# Patient Record
Sex: Female | Born: 1955 | Race: Asian | Hispanic: No
Health system: Southern US, Community
[De-identification: ages and names within clinical notes are randomized; demographics above are authoritative.]

---

## 2017-06-06 ENCOUNTER — Emergency Department (HOSPITAL_COMMUNITY): Payer: BLUE CROSS/BLUE SHIELD

## 2017-06-06 ENCOUNTER — Emergency Department (HOSPITAL_COMMUNITY)
Admission: EM | Admit: 2017-06-06 | Discharge: 2017-06-06 | Disposition: A | Discharge status: Home / Self Care | Payer: BLUE CROSS/BLUE SHIELD | Attending: Emergency Medicine | Admitting: Emergency Medicine

## 2017-06-06 DIAGNOSIS — S42202A Unspecified fracture of upper end of left humerus, initial encounter for closed fracture: Secondary | ICD-10-CM

## 2017-06-06 DIAGNOSIS — S42295A Other nondisplaced fracture of upper end of left humerus, initial encounter for closed fracture: Secondary | ICD-10-CM | POA: Insufficient documentation

## 2017-06-06 DIAGNOSIS — S4992XA Unspecified injury of left shoulder and upper arm, initial encounter: Secondary | ICD-10-CM | POA: Diagnosis present

## 2017-06-06 DIAGNOSIS — Y929 Unspecified place or not applicable: Secondary | ICD-10-CM | POA: Insufficient documentation

## 2017-06-06 DIAGNOSIS — Y998 Other external cause status: Secondary | ICD-10-CM | POA: Insufficient documentation

## 2017-06-06 DIAGNOSIS — W230XXA Caught, crushed, jammed, or pinched between moving objects, initial encounter: Secondary | ICD-10-CM | POA: Insufficient documentation

## 2017-06-06 DIAGNOSIS — Y939 Activity, unspecified: Secondary | ICD-10-CM | POA: Diagnosis not present

## 2017-06-06 MED ORDER — OXYCODONE-ACETAMINOPHEN 5-325 MG PO TABS: 1.0000 | ORAL_TABLET | ORAL | 0 refills | Status: AC | PRN

## 2017-06-06 MED ORDER — MORPHINE SULFATE (PF) 4 MG/ML IV SOLN
4.0000 mg | Freq: Once | INTRAVENOUS | Status: AC
  Administered 2017-06-06: 4 mg via INTRAVENOUS
  Filled 2017-06-06: qty 1

## 2017-06-06 NOTE — ED Notes (Signed)
ED Provider at bedside. 
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
## 2017-06-06 NOTE — Progress Notes (Signed)
Orthopedic Tech Progress Note Patient Details:  Nichole Hammond 09/06/1955 [id number]  Ortho Devices Type of Ortho Device: Arm sling Ortho Device/Splint Interventions: Application   Saul FordyceJennifer C Vianka Ertel 06/06/2017, 1:13 PM

## 2017-06-06 NOTE — ED Notes (Signed)
Patient transported to X-ray 

## 2017-06-06 NOTE — ED Notes (Signed)
Ortho contacted for shoulder sling

## 2018-05-09 IMAGING — CT CT CERVICAL SPINE W/O CM
3 of 4 series · 12 of 35 positions shown, 14 images · non-contrast
Comparison: None.

CLINICAL DATA: Pain following fall

EXAM:
CT HEAD WITHOUT CONTRAST
CT CERVICAL SPINE WITHOUT CONTRAST
TECHNIQUE: Multidetector CT imaging of the head and cervical spine was
performed following the standard protocol without intravenous
contrast. Multiplanar CT image reconstructions of the cervical spine
were also generated.

[Series 4: head bone · axial · 0.39mm/px · z∈[+1076,+1176]mm · 4 of 76 slices shown, 5 images]
[im 17/76  soft-tissue]
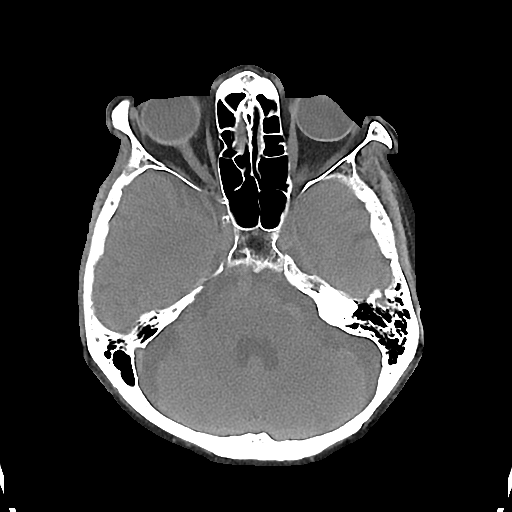
[im 17/76  bone]
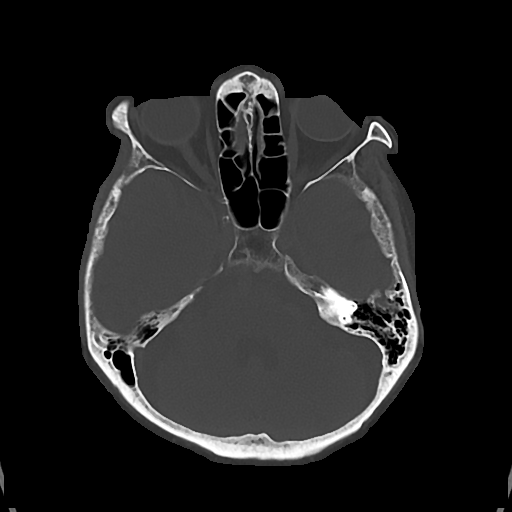
[im 34/76  bone]
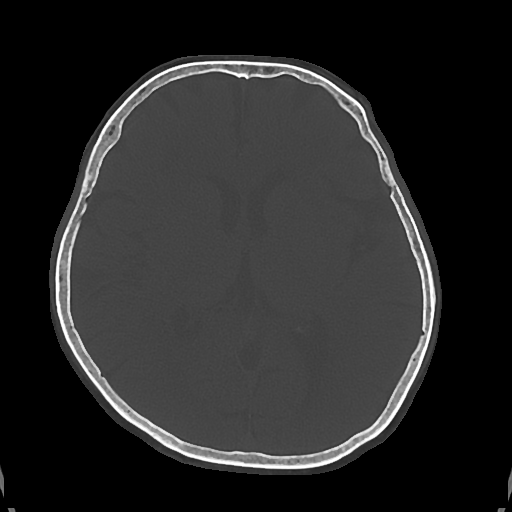
[im 51/76  bone]
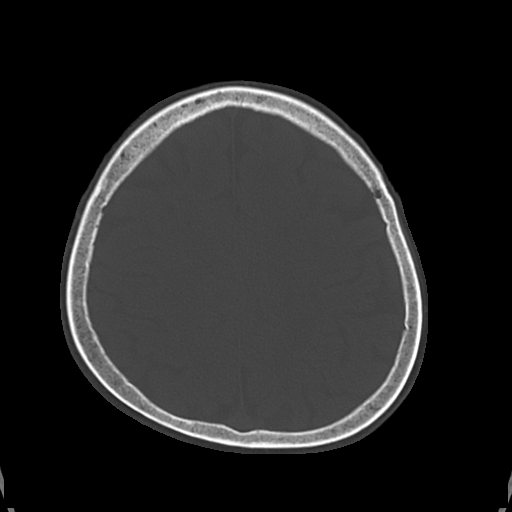
[im 67/76  bone]
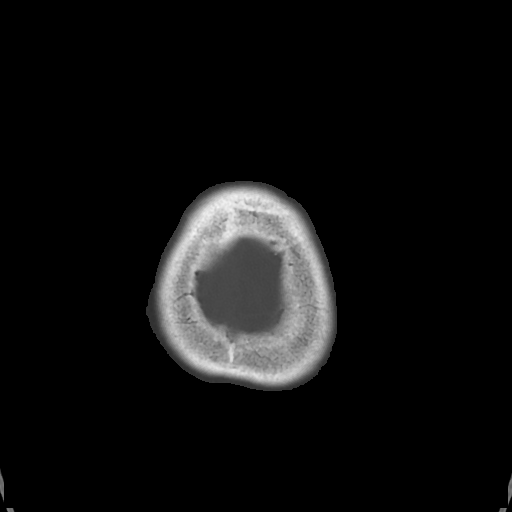

[Series 5: cor soft · coronal · 0.29mm/px · 3 of 67 slices shown]
[im 14/67  bone]
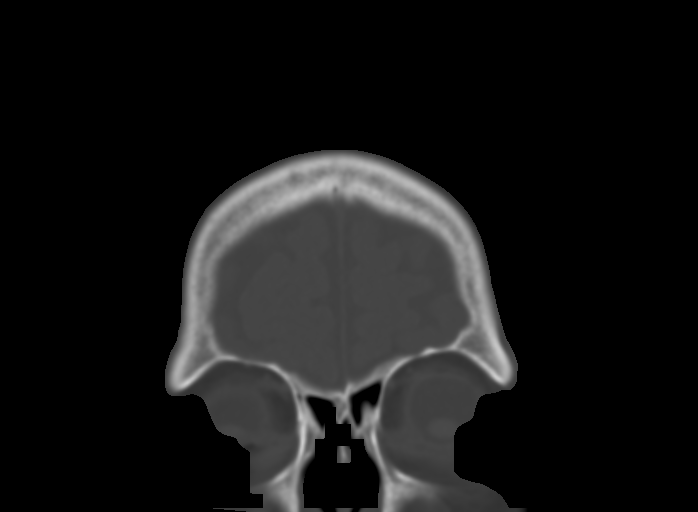
[im 27/67  bone]
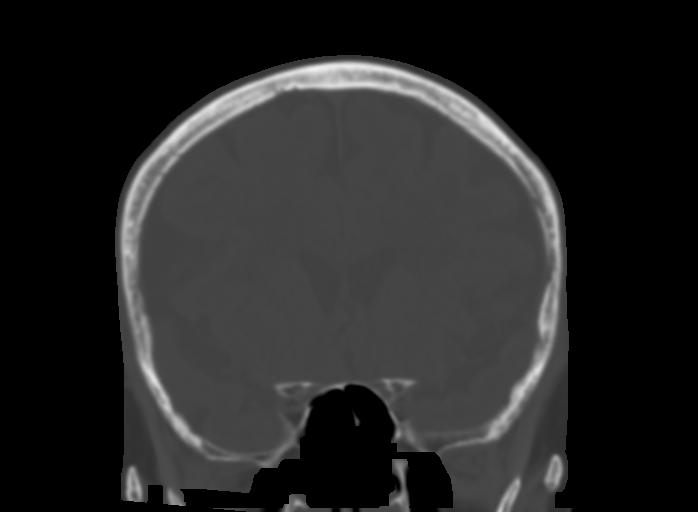
[im 40/67  bone]
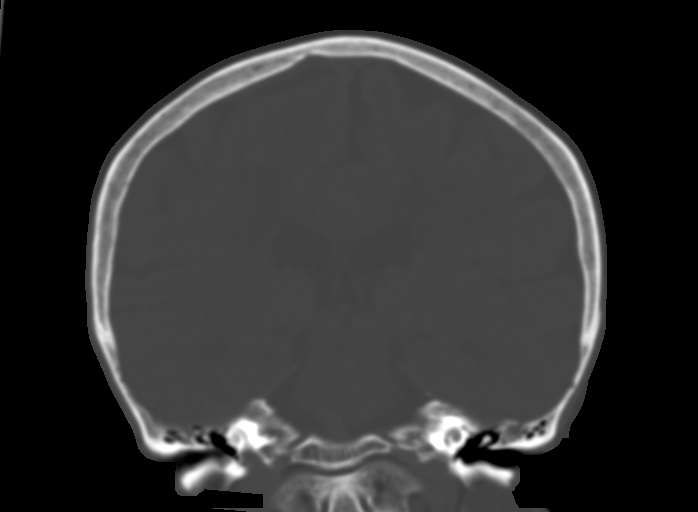

[Series 6: sag soft · sagittal · 0.33mm/px · 5 of 67 slices shown, 6 images]
[im 23/67  bone]
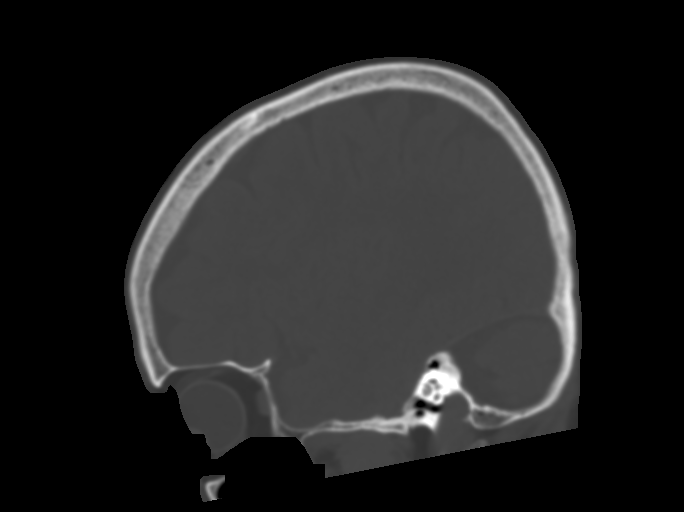
[im 28/67  bone]
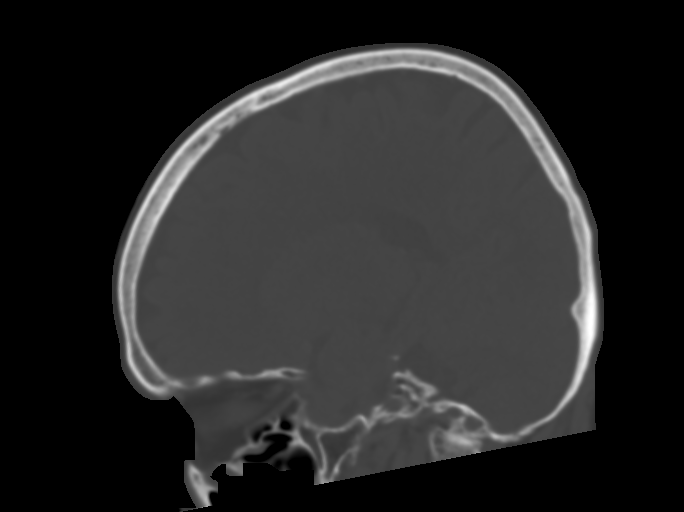
[im 34/67  soft-tissue]
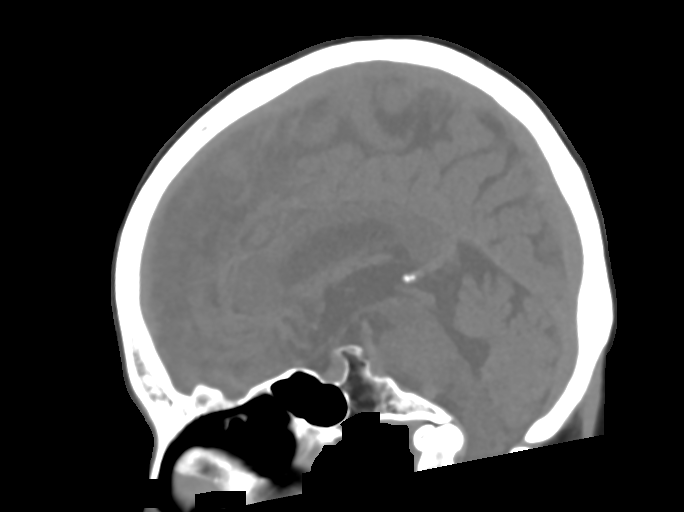
[im 34/67  bone]
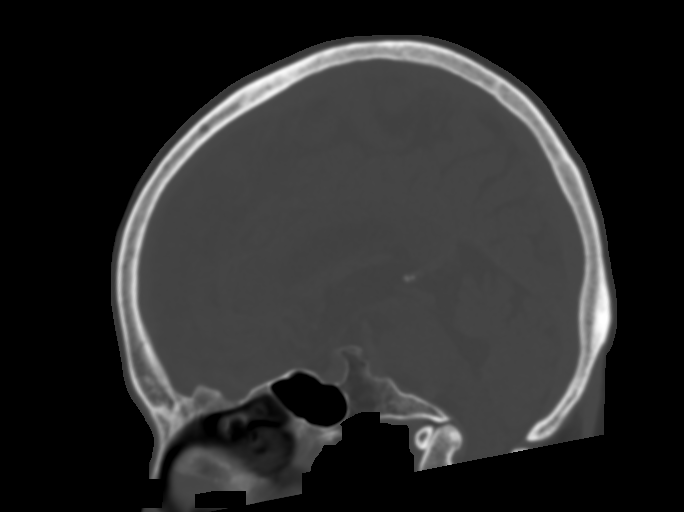
[im 39/67  bone]
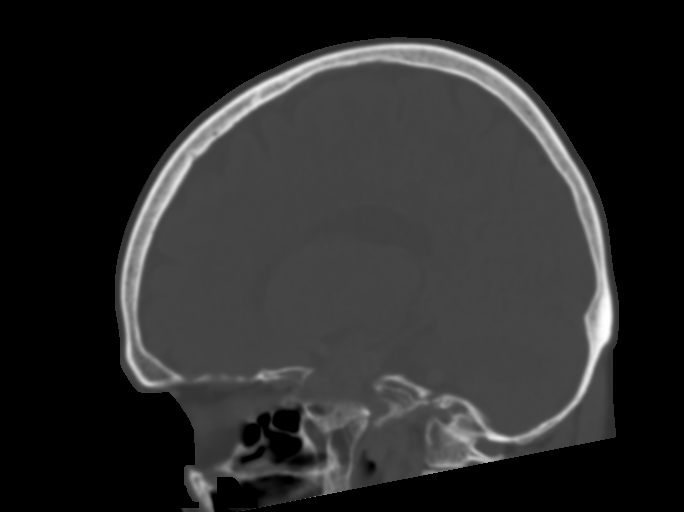
[im 45/67  bone]
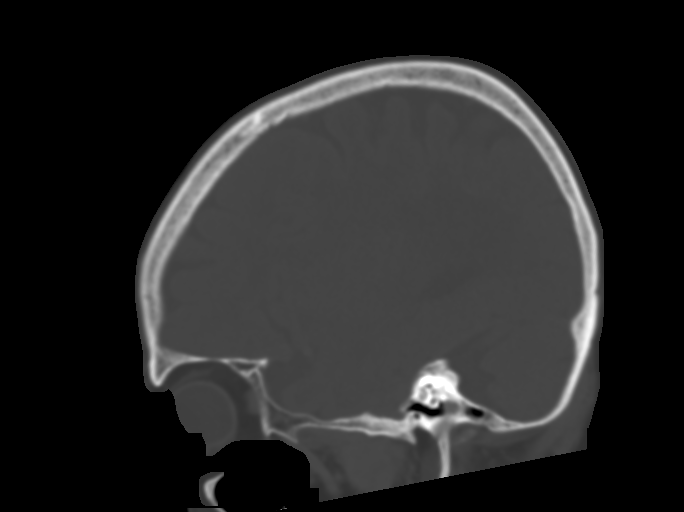

[12 of 35 positions shown; findings below may reference images not displayed]

FINDINGS: CT HEAD FINDINGS

Brain: The ventricles are normal in size and configuration. There is
mild generalized sulcal atrophy, however. There is no intracranial
mass, hemorrhage, extra-axial fluid collection, or midline shift.
There is patchy small vessel disease throughout the centra semiovale
bilaterally. Elsewhere gray-white compartments appear normal. No
evident acute infarct.

Vascular: No appreciable hyperdense vessel. There is calcification
in the carotid siphon regions bilaterally.

Skull: Bony calvarium appears intact.

Sinuses/Orbits: Mucosal thickening is noted in several ethmoid air
cells bilaterally. Frontal sinuses are nearly aplastic. Other
visualized paranasal sinuses are clear. Orbits appear symmetric
bilaterally.

Other: Mastoid air cells are clear.

CT CERVICAL SPINE FINDINGS

Alignment: There is cervical levoscoliosis. There is no appreciable
spondylolisthesis.

Skull base and vertebrae: Skull base and craniocervical junction
regions appear unremarkable. No fracture is evident. No blastic or
lytic bone lesions.

Soft tissues and spinal canal: Prevertebral soft tissues and
predental space regions are normal. No paraspinous lesions are
evident. No cord or canal hematoma is appreciable.

Disc levels: There is moderate disc space narrowing at C3-4, C4-5,
and C5-6. There is no appreciable nerve root edema or effacement. No
disc extrusion or stenosis.

Upper chest: Visualized upper lung regions are clear. There is
aortic atherosclerosis.

Other: There is calcification in each carotid artery.
IMPRESSION: CT head: Mild generalized sulcal atrophy with ventricles are normal
in appearance. Patchy periventricular small vessel disease noted. No
acute infarct evident. No evident mass hemorrhage, or extra-axial
fluid collection.

There are foci of arterial vascular calcification. There is mucosal
thickening in several ethmoid air cells.

CT cervical spine: No fracture or spondylolisthesis. Disc space
narrowing at several levels. There is aortic atherosclerosis as well
as calcification in each carotid artery.

Aortic Atherosclerosis ([id number]-543.3).
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
